# Patient Record
Sex: Female | Born: 1965 | Race: Black or African American | Hispanic: No | Marital: Single | Smoking: Never smoker
Health system: Southern US, Community
[De-identification: ages and names within clinical notes are randomized; demographics above are authoritative.]

## PROBLEM LIST (undated history)

## (undated) ENCOUNTER — Emergency Department (HOSPITAL_COMMUNITY): Payer: Self-pay

## (undated) DIAGNOSIS — I10 Essential (primary) hypertension: Secondary | ICD-10-CM

---

## 2015-04-17 ENCOUNTER — Encounter (HOSPITAL_COMMUNITY): Payer: Self-pay | Admitting: *Deleted

## 2015-04-17 ENCOUNTER — Emergency Department (HOSPITAL_COMMUNITY): Payer: Self-pay

## 2015-04-17 ENCOUNTER — Emergency Department (HOSPITAL_COMMUNITY)
Admission: EM | Admit: 2015-04-17 | Discharge: 2015-04-18 | Disposition: A | Discharge status: Home / Self Care | Payer: Self-pay | Attending: Emergency Medicine | Admitting: Emergency Medicine

## 2015-04-17 DIAGNOSIS — R000 Tachycardia, unspecified: Secondary | ICD-10-CM | POA: Insufficient documentation

## 2015-04-17 DIAGNOSIS — J029 Acute pharyngitis, unspecified: Secondary | ICD-10-CM | POA: Insufficient documentation

## 2015-04-17 DIAGNOSIS — I10 Essential (primary) hypertension: Secondary | ICD-10-CM | POA: Insufficient documentation

## 2015-04-17 DIAGNOSIS — J40 Bronchitis, not specified as acute or chronic: Secondary | ICD-10-CM | POA: Insufficient documentation

## 2015-04-17 HISTORY — DX: Essential (primary) hypertension: I10

## 2015-04-17 MED ORDER — SODIUM CHLORIDE 0.9 % IV BOLUS (SEPSIS)
1000.0000 mL | Freq: Once | INTRAVENOUS | Status: AC
  Administered 2015-04-17: 1000 mL via INTRAVENOUS

## 2015-04-17 MED ORDER — BENZONATATE 100 MG PO CAPS: 100.0000 mg | ORAL_CAPSULE | Freq: Once | ORAL | Status: AC

## 2015-04-17 MED ORDER — PREDNISONE 20 MG PO TABS
10.0000 mg | ORAL_TABLET | Freq: Every day | ORAL | Status: DC
  Administered 2015-04-17: 10 mg via ORAL
  Filled 2015-04-17: qty 1

## 2015-04-17 MED ORDER — LORAZEPAM 2 MG/ML IJ SOLN
1.0000 mg | Freq: Once | INTRAMUSCULAR | Status: AC
  Administered 2015-04-17: 1 mg via INTRAVENOUS
  Filled 2015-04-17: qty 1

## 2015-04-17 MED ORDER — ALBUTEROL SULFATE HFA 108 (90 BASE) MCG/ACT IN AERS
2.0000 | INHALATION_SPRAY | Freq: Once | RESPIRATORY_TRACT | Status: AC
  Administered 2015-04-18: 2 via RESPIRATORY_TRACT
  Filled 2015-04-17: qty 6.7

## 2015-04-17 MED ORDER — IPRATROPIUM-ALBUTEROL 0.5-2.5 (3) MG/3ML IN SOLN
3.0000 mL | RESPIRATORY_TRACT | Status: DC
  Administered 2015-04-17 (×2): 3 mL via RESPIRATORY_TRACT
  Filled 2015-04-17 (×2): qty 3

## 2015-04-17 MED ORDER — AZITHROMYCIN 250 MG PO TABS: ORAL_TABLET | ORAL | Status: AC

## 2015-04-17 MED ORDER — PREDNISONE 20 MG PO TABS: ORAL_TABLET | ORAL | Status: AC

## 2015-04-17 MED ORDER — BENZONATATE 100 MG PO CAPS
100.0000 mg | ORAL_CAPSULE | Freq: Once | ORAL | Status: AC
  Administered 2015-04-17: 100 mg via ORAL
  Filled 2015-04-17: qty 1

## 2015-04-17 MED ORDER — HYDROCODONE-HOMATROPINE 5-1.5 MG/5ML PO SYRP: 5.0000 mL | ORAL_SOLUTION | Freq: Four times a day (QID) | ORAL | Status: AC | PRN

## 2015-04-17 NOTE — ED Provider Notes (Signed)
CSN: [id number]     Arrival date & time 04/17/15  2020 History   First MD Initiated Contact with Patient 04/17/15 2100     Chief Complaint  Patient presents with  . Panic Attack      (Consider location/radiation/quality/duration/timing/severity/associated sxs/prior Treatment) Patient is a 49 y.o. female presenting with cough.  Cough Cough characteristics:  Barking and non-productive Onset quality:  Gradual Duration:  1 day Timing:  Constant Progression:  Worsening Chronicity:  Recurrent Smoker: no   Context: animal exposure   Relieved by:  None tried Worsened by:  Nothing tried Ineffective treatments:  None tried Associated symptoms: shortness of breath, sinus congestion and sore throat   Associated symptoms: no chest pain, no chills, no ear pain, no fever, no rash and no rhinorrhea     Past Medical History  Diagnosis Date  . Hypertension    Past Surgical History  Procedure Laterality Date  . Cesarean section     No family history on file. Social History  Substance Use Topics  . Smoking status: Never Smoker   . Smokeless tobacco: Never Used  . Alcohol Use: No   OB History    No data available     Review of Systems  Constitutional: Negative for fever and chills.  HENT: Positive for congestion and sore throat. Negative for ear pain and rhinorrhea.   Eyes: Negative for pain and itching.  Respiratory: Positive for cough and shortness of breath. Negative for choking and chest tightness.   Cardiovascular: Negative for chest pain and palpitations.  Gastrointestinal: Negative for abdominal pain.  Skin: Negative for rash.  All other systems reviewed and are negative.     Allergies  Other and Peanut butter flavor  Home Medications   Prior to Admission medications   Medication Sig Start Date End Date Taking? Authorizing Provider  albuterol (PROVENTIL) (2.5 MG/3ML) 0.083% nebulizer solution Take 2.5 mg by nebulization every 6 (six) hours as needed for wheezing  or shortness of breath.   Yes Historical Provider, MD  PRESCRIPTION MEDICATION Take 1 tablet by mouth daily. "BP med"   Yes Historical Provider, MD  azithromycin (ZITHROMAX) 250 MG tablet Take two the first day and 1 each day after until medication is gone 04/17/15   Marily MemosJason Nashayla Telleria, MD  benzonatate (TESSALON) 100 MG capsule Take 1 capsule (100 mg total) by mouth once. 04/17/15   Marily MemosJason Tarvares Lant, MD  HYDROcodone-homatropine [facility](HYCODAN) 5-1.5 MG/5ML syrup Take 5 mLs by mouth every 6 (six) hours as needed for cough. 04/17/15   Marily MemosJason Chett Taniguchi, MD  predniSONE (DELTASONE) 20 MG tablet 2 tabs po daily x 4 days 04/17/15   Marily MemosJason Artez Regis, MD   BP 115/105 mmHg  Pulse 122  Temp(Src) 97.4 F (36.3 C) (Oral)  Resp 27  Ht 5\' 7"  (1.702 m)  Wt 274 lb (124.286 kg)  BMI 42.90 kg/m2  SpO2 100%  LMP 04/16/2014 (Within Days) Physical Exam  Constitutional: She is oriented to person, place, and time. She appears well-developed and well-nourished.  HENT:  Head: Normocephalic and atraumatic.  Eyes: Conjunctivae are normal. Pupils are equal, round, and reactive to light.  Neck: Normal range of motion.  Cardiovascular: Regular rhythm.  Tachycardia present.   Pulmonary/Chest: No stridor. No respiratory distress. She has wheezes.  Abdominal: Soft. She exhibits no distension. There is no tenderness.  Musculoskeletal: Normal range of motion. She exhibits no edema or tenderness.  Neurological: She is alert and oriented to person, place, and time. No cranial nerve deficit. Coordination normal.  Skin:  Skin is warm and dry.  Nursing note and vitals reviewed.   ED Course  Procedures (including critical care time) Labs Review Labs Reviewed - No data to display  Imaging Review Dg Chest Portable 1 View  04/17/2015  CLINICAL DATA:  History of hypertension.  Evaluation for pneumonia. EXAM: PORTABLE CHEST 1 VIEW COMPARISON:  None. FINDINGS: Normal heart size and pulmonary vascularity. No focal airspace disease or consolidation  in the lungs. Shallow inspiration. No blunting of costophrenic angles. No pneumothorax. Mediastinal contours appear intact. IMPRESSION: No evidence of active pulmonary disease. Electronically Signed   By: Burman Nieves M.D.   On: 04/17/2015 21:38   I have personally reviewed and evaluated these images and lab results as part of my medical decision-making.   EKG Interpretation   Date/Time:  Saturday April 17 2015 20:40:15 EST Ventricular Rate:  120 PR Interval:  134 QRS Duration: 86 QT Interval:  334 QTC Calculation: 472 R Axis:   40 Text Interpretation:  Sinus tachycardia ST \\T \ T wave abnormality,  consider inferior ischemia Abnormal ECG Confirmed by [facility] MD, Barbara Cower  [phone]) on 04/17/2015 9:01:37 PM      MDM   Final diagnoses:  Bronchitis   Likely acute bronchitis. Still tachycardic after treatments, likely 2/2 albuterol. Still 100% O2, not tachypneic, improved lung sounds. Rest of vitals normal. Brother says she overreacts to things adn I think it is playing a role here too as her coughing, HR, BP improved with ativan as well as bronchoconstrictive treatments. Will continue to do steroids, abx, albuterol at home. Will return here for new/worsening symptoms.      Marily Memos, MD 04/18/15 1620

## 2015-04-17 NOTE — ED Notes (Signed)
MD at bedside. 

## 2015-04-17 NOTE — ED Notes (Signed)
Patient presents with c/o diff breathing  While in triage was breathing fast (appeared to be a panic attack)  Encouraged patient to take deep slow breaths

## 2017-04-10 IMAGING — CR DG CHEST 1V PORT
1 series · 1 of 1 positions shown · non-contrast
Comparison: None.

CLINICAL DATA: History of hypertension.  Evaluation for pneumonia.

EXAM:
PORTABLE CHEST 1 VIEW

[AP]
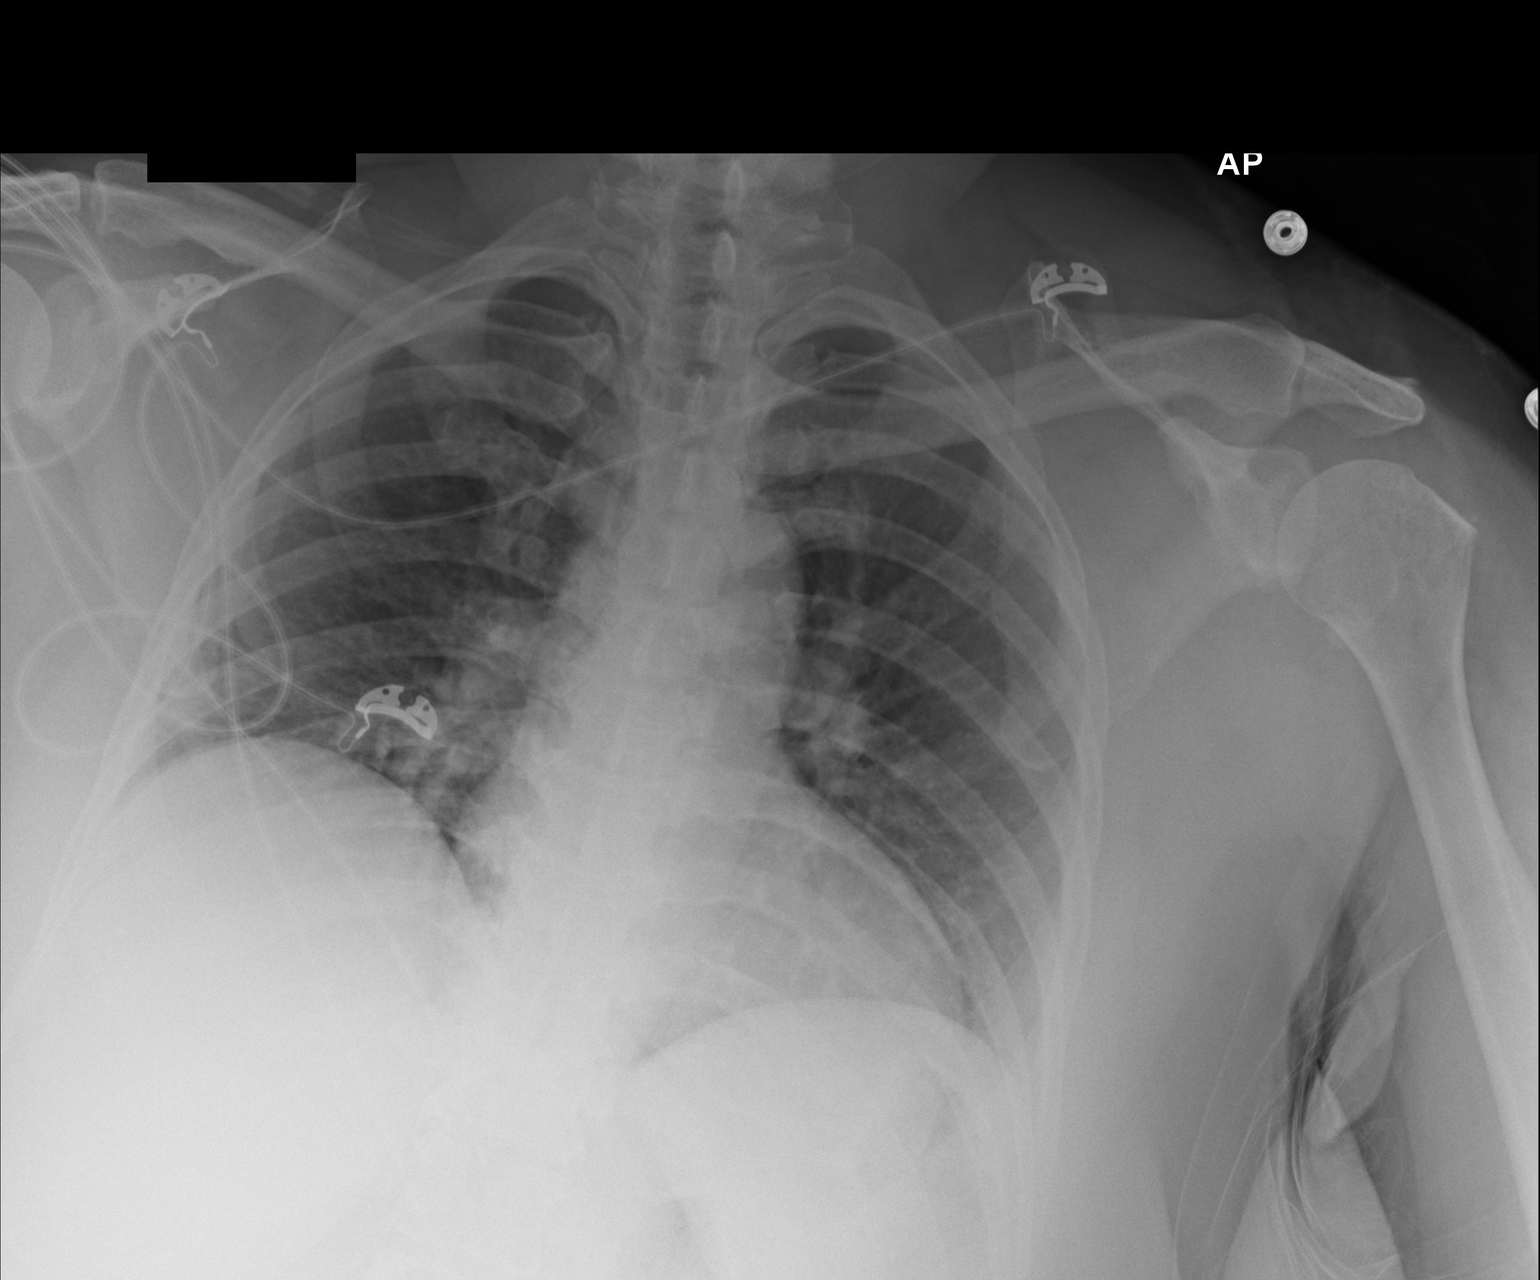

[1 of 1 positions shown; findings below may reference images not displayed]

FINDINGS: Normal heart size and pulmonary vascularity. No focal airspace
disease or consolidation in the lungs. Shallow inspiration. No
blunting of costophrenic angles. No pneumothorax. Mediastinal
contours appear intact.
IMPRESSION: No evidence of active pulmonary disease.
